# Patient Record
Sex: Female | Born: 1975 | Race: White | Hispanic: No | Marital: Married
Health system: Southern US, Community
[De-identification: ages and names within clinical notes are randomized; demographics above are authoritative.]

---

## 2009-03-15 ENCOUNTER — Encounter: Admission: RE | Admit: 2009-03-15 | Discharge: 2009-03-15 | Payer: Self-pay | Admitting: Obstetrics and Gynecology

## 2012-04-27 ENCOUNTER — Encounter: Payer: Self-pay | Admitting: Obstetrics & Gynecology

## 2012-04-27 DIAGNOSIS — Z01419 Encounter for gynecological examination (general) (routine) without abnormal findings: Secondary | ICD-10-CM

## 2018-02-10 ENCOUNTER — Other Ambulatory Visit: Payer: Self-pay | Admitting: Obstetrics and Gynecology

## 2018-04-30 ENCOUNTER — Emergency Department (HOSPITAL_COMMUNITY): Payer: BLUE CROSS/BLUE SHIELD

## 2018-04-30 ENCOUNTER — Emergency Department (HOSPITAL_COMMUNITY)
Admission: EM | Admit: 2018-04-30 | Discharge: 2018-05-01 | Disposition: A | Discharge status: Home / Self Care | Payer: BLUE CROSS/BLUE SHIELD | Attending: Emergency Medicine | Admitting: Emergency Medicine

## 2018-04-30 ENCOUNTER — Encounter (HOSPITAL_COMMUNITY): Payer: Self-pay | Admitting: Emergency Medicine

## 2018-04-30 DIAGNOSIS — R079 Chest pain, unspecified: Secondary | ICD-10-CM | POA: Diagnosis present

## 2018-04-30 DIAGNOSIS — Z79899 Other long term (current) drug therapy: Secondary | ICD-10-CM | POA: Diagnosis not present

## 2018-04-30 DIAGNOSIS — R0789 Other chest pain: Secondary | ICD-10-CM | POA: Diagnosis not present

## 2018-04-30 LAB — BASIC METABOLIC PANEL
Anion gap: 11 (ref 5–15)
BUN: 8 mg/dL (ref 6–20)
CO2: 25 mmol/L (ref 22–32)
Calcium: 9.3 mg/dL (ref 8.9–10.3)
Chloride: 106 mmol/L (ref 98–111)
Creatinine, Ser: 0.7 mg/dL (ref 0.44–1.00)
GFR calc Af Amer: >60 mL/min (ref >60)
GFR calc non Af Amer: >60 mL/min (ref >60)
Glucose, Bld: 106 mg/dL — ABNORMAL HIGH (ref 70–99)
Potassium: 3.8 mmol/L (ref 3.5–5.1)
Sodium: 142 mmol/L (ref 135–145)

## 2018-04-30 LAB — CBC
HCT: 45.1 % (ref 36.0–46.0)
Hemoglobin: 14.5 g/dL (ref 12.0–15.0)
MCH: 27.7 pg (ref 26.0–34.0)
MCHC: 32.2 g/dL (ref 30.0–36.0)
MCV: 86.2 fL (ref 80.0–100.0)
Platelets: 359 10*3/uL (ref 150–400)
RBC: 5.23 MIL/uL — ABNORMAL HIGH (ref 3.87–5.11)
RDW: 12.8 % (ref 11.5–15.5)
WBC: 12 10*3/uL — ABNORMAL HIGH (ref 4.0–10.5)
nRBC: 0 % (ref 0.0–0.2)

## 2018-04-30 LAB — I-STAT BETA HCG BLOOD, ED (MC, WL, AP ONLY): I-stat hCG, quantitative: <5 m[IU]/mL (ref <5)

## 2018-04-30 LAB — I-STAT TROPONIN, ED: Troponin i, poc: 0 ng/mL (ref 0.00–0.08)

## 2018-04-30 MED ORDER — SODIUM CHLORIDE 0.9 % IV BOLUS
1000.0000 mL | Freq: Once | INTRAVENOUS | Status: AC
  Administered 2018-04-30: 1000 mL via INTRAVENOUS

## 2018-04-30 MED ORDER — NITROGLYCERIN 0.4 MG SL SUBL
0.4000 mg | SUBLINGUAL_TABLET | SUBLINGUAL | Status: DC | PRN
Start: 2018-04-30 — End: 2018-05-01
  Administered 2018-04-30 (×2): 0.4 mg via SUBLINGUAL
  Filled 2018-04-30: qty 1

## 2018-04-30 MED ORDER — ONDANSETRON HCL 4 MG/2ML IJ SOLN
4.0000 mg | Freq: Once | INTRAMUSCULAR | Status: AC
  Administered 2018-04-30: 4 mg via INTRAVENOUS
  Filled 2018-04-30: qty 2

## 2018-04-30 NOTE — ED Provider Notes (Signed)
[facility]   CSN: [id number] Arrival date & time: 04/30/18  1928     History   Chief Complaint Chief Complaint  Patient presents with  . Chest Pain  . Near Syncope    HPI Melissa Wang is a 42 y.o. female with history of anxiety, asthma, irritable bowel syndrome, mild mitral valve prolapse, hypertension, chronic fatigue presents for evaluation of acute onset, improved episode of chest pain.  Patient reports that for the last week or so she has noted her blood pressure has been higher than usual.  She reports that her blood pressures typically well controlled with her medications with average BP around 130/90.  This week her blood pressure has been around 160/115.  She has been taking her medications as prescribed.  She does note some increased stress with her 541 year old daughter deciding to leave her home "on bad terms "and her PCP started her on a low-dose Xanax which she has been taking around once daily as needed for anxiety with improvement.  She does note that she has been having intermittent headaches consistent with migraines she has had in the past this week and went to her PCP who gave her a shot of "a medication that started with the letter T" with improvement in her headaches.   At around 6 PM while sitting in a movie theater she began to feel substernal chest tightness and pressure which radiated up to her neck and jaw.  She felt associated nausea and lightheadedness and fatigue but no vomiting or syncope.  She reports she did feel cold during this episode which lasted approximately 30 to 45 minutes and resolved.  She reports that she does have some very mild chest tightness at this time but not like her initial symptoms.  She was not given any medications with EMS.  No aggravating or alleviating factors noted.  She is a non-smoker, no recreational drug use or excessive alcohol intake.  She does see a cardiologist for her  hypertension, mitral valve prolapse, and palpitations.  She denies recent travel or surgeries, hemoptysis, prior history of DVT or PE, or current use of OCPs.  The history is provided by the patient.    History reviewed. No pertinent past medical history.  There are no active problems to display for this patient.   OB History   No obstetric history on file.      Home Medications    Prior to Admission medications   Medication Sig Start Date End Date Taking? Authorizing Provider  albuterol (PROAIR HFA) 108 (90 Base) MCG/ACT inhaler Inhale 2 puffs into the lungs every 6 (six) hours as needed for wheezing or shortness of breath.   Yes [provider]  ALPRAZolam (XANAX) 0.25 MG tablet Take 0.25 mg by mouth daily as needed for anxiety.    Yes [provider]  Aspirin-Acetaminophen-Caffeine (GOODY HEADACHE PO) Take 1 packet by mouth daily as needed (for severe headaches).   Yes [provider]  Cholecalciferol (VITAMIN D-3) 25 MCG (1000 UT) CAPS Take 1,000 Units by mouth daily.   Yes [provider]  ibuprofen (ADVIL,MOTRIN) 200 MG tablet Take 200-800 mg by mouth every 6 (six) hours as needed (for pain).   Yes [provider]  levocetirizine (XYZAL) 5 MG tablet Take 5 mg by mouth daily.   Yes [provider]  montelukast (SINGULAIR) 10 MG tablet Take 10 mg by mouth at bedtime. 08/19/17  Yes [provider]  Nebulizer MISC  Take 1 ampule by nebulization every 6 (six) hours as needed (for shortness of breath or wheezing).   Yes [provider]  Nutritional Supplements (JUICE PLUS FIBRE PO) Take 1 capsule by mouth daily.    Yes [provider]  pantoprazole (PROTONIX) 40 MG tablet Take 40 mg by mouth See admin instructions. Take 40 mg by mouth at bedtime and an additional 40 mg once a day as needed for heartburn 07/19/17  Yes [provider]  rizatriptan (MAXALT-MLT) 10 MG disintegrating tablet Take 10 mg by  mouth See admin instructions. Dissolve 10 mg in the mouth at onset of migraine headache and may repeat in 2 hours, if no relief   Yes [provider]  verapamil (CALAN-SR) 180 MG CR tablet Take 180 mg by mouth 2 (two) times daily. 07/19/17  Yes [provider]    Family History History reviewed. No pertinent family history.  Social History Social History   Tobacco Use  . Smoking status: Not on file  Substance Use Topics  . Alcohol use: Not on file  . Drug use: Not on file     Allergies   Prednisone   Review of Systems Review of Systems  Constitutional: Positive for fatigue.  Respiratory: Positive for chest tightness and shortness of breath.   Cardiovascular: Positive for chest pain.  Gastrointestinal: Positive for nausea. Negative for abdominal pain and vomiting.  Neurological: Positive for headaches (resolved).  All other systems reviewed and are negative.    Physical Exam Updated Vital Signs BP (!) 141/81 (BP Location: Right Arm)   Pulse 64   Temp 97.9 F (36.6 C) (Oral)   Resp 15   Ht 4\' 11"  (1.499 m)   Wt 79.8 kg   SpO2 98%   BMI 35.55 kg/m   Physical Exam Vitals signs and nursing note reviewed.  Constitutional:      General: She is not in acute distress.    Appearance: She is well-developed.  HENT:     Head: Normocephalic and atraumatic.  Eyes:     General:        Right eye: No discharge.        Left eye: No discharge.     Conjunctiva/sclera: Conjunctivae normal.  Neck:     Vascular: No JVD.     Trachea: No tracheal deviation.  Cardiovascular:     Rate and Rhythm: Normal rate and regular rhythm.     Comments: 2+ radial and DP/PT pulses bilaterally, Homans sign absent bilaterally, no lower extremity edema, no palpable cords, compartments are soft  Pulmonary:     Effort: Pulmonary effort is normal.     Breath sounds: Normal breath sounds.  Chest:     Chest wall: No mass or tenderness.  Abdominal:     General: Bowel sounds are  normal. There is no distension.     Palpations: Abdomen is soft.     Tenderness: There is no abdominal tenderness.  Musculoskeletal:     Right lower leg: She exhibits no tenderness. No edema.     Left lower leg: She exhibits no tenderness. No edema.  Skin:    General: Skin is warm and dry.     Findings: No erythema.  Neurological:     Mental Status: She is alert.  Psychiatric:        Behavior: Behavior normal.      ED Treatments / Results  Labs (all labs ordered are listed, but only abnormal results are displayed) Labs Reviewed  BASIC  METABOLIC PANEL - Abnormal; Notable for the following components:      Result Value   Glucose, Bld 106 (*)    All other components within normal limits  CBC - Abnormal; Notable for the following components:   WBC 12.0 (*)    RBC 5.23 (*)    All other components within normal limits  I-STAT TROPONIN, ED  I-STAT BETA HCG BLOOD, ED ([facility], [facility], [facility] ONLY)  I-STAT TROPONIN, ED    EKG EKG Interpretation  Date/Time:  Friday April 30 2018 19:30:54 EST Ventricular Rate:  79 PR Interval:    QRS Duration: 94 QT Interval:  389 QTC Calculation: 446 R Axis:   98 Text Interpretation:  Sinus rhythm Borderline right axis deviation Low voltage, precordial leads Confirmed by Raeford Razor [phone]) on 04/30/2018 7:58:55 PM   Radiology Dg Chest 2 View  Result Date: 04/30/2018 CLINICAL DATA:  Short of breath with chest pain EXAM: CHEST - 2 VIEW COMPARISON:  06/19/2008 FINDINGS: The heart size and mediastinal contours are within normal limits. Both lungs are clear. Rightward scoliosis of the spine. IMPRESSION: No active cardiopulmonary disease. Electronically Signed   By: Jasmine Pang M.D.   On: 04/30/2018 20:14    Procedures Procedures (including critical care time)  Medications Ordered in ED Medications  nitroGLYCERIN (NITROSTAT) SL tablet 0.4 mg (0.4 mg Sublingual Given 04/30/18 2130)  sodium chloride 0.9 % bolus 1,000 mL (0 mLs Intravenous  Stopped 04/30/18 2249)  ondansetron (ZOFRAN) injection 4 mg (4 mg Intravenous Given 04/30/18 2056)     Initial Impression / Assessment and Plan / ED Course  I have reviewed the triage vital signs and the nursing notes.  Pertinent labs & imaging results that were available during my care of the patient were reviewed by me and considered in my medical decision making (see chart for details).     Patient presents for evaluation of substernal chest pain and lightheadedness.  She is afebrile, initially hypertensive with improvement on reevaluation.  She is nontoxic in appearance.  Pain is not pleuritic, exertional, or reproducible on palpation.  She was given sublingual nitroglycerin in the ED without significant relief but eventually her symptoms began to improve.  Lab work reviewed by me shows mild nonspecific leukocytosis, no anemia, no metabolic derangements or renal insufficiency.  Serial troponins are negative and EKG shows normal sinus rhythm with no ischemic changes.  Chest x-ray shows no acute cardiopulmonary abnormalities.  No evidence of ACS/MI, PE, dissection, cardiac tamponade, esophageal rupture, pneumonia, or pneumothorax.  No abdominal pain to suggest intra-abdominal pathology such as cholecystitis, pancreatitis, gastritis.  On reevaluation patient is resting comfortably no apparent distress.  She reports her pain is significantly improved and she feels comfortable with discharge home.  She does have a cardiologist with whom she can follow-up.  Discuss strict ED return precautions.  Recommend follow-up with cardiologist in the next 2 to 3 days.  Patient and husband verbalized understanding of and agreement with plan and patient is stable for discharge home at this time.  Final Clinical Impressions(s) / ED Diagnoses   Final diagnoses:  Atypical chest pain    ED Discharge Orders    None       Bennye Alm 05/01/18 0027    Raeford Razor, MD 05/02/18 [phone]

## 2018-04-30 NOTE — ED Triage Notes (Signed)
  Patient BIB EMS for chest pain and near syncopal episode.  Patient was at movie theater with her husband and started to feel dizzy and had a headache, patient took her migraine medication and started having mid sternal chest pains about 30 mins later.  Patient was having chest tightness with pain radiating to her neck.  Husband states she started to fall and he kept her from hitting the floor.  Pain currently 1/10.  Has nausea but no vomiting.

## 2018-05-01 LAB — I-STAT TROPONIN, ED: Troponin i, poc: 0 ng/mL (ref 0.00–0.08)

## 2018-05-01 NOTE — ED Notes (Signed)
Patient verbalizes understanding of medications and discharge instructions. No further questions at this time. VSS and patient ambulatory at discharge.   

## 2018-05-01 NOTE — Discharge Instructions (Addendum)
Your workup today was reassuring that you are not having a heart attack.  Please follow-up with your cardiologist for reevaluation of your symptoms.  In the meantime, continue to take your home medicines as prescribed.  You can take ibuprofen or Tylenol as needed for pain.  Alternate every 3-4 hours.  Return to the emergency department if any concerning signs or symptoms develop such as high fevers, worsening chest pain or persistent shortness of breath, passing out, or persistent vomiting.

## 2019-08-23 IMAGING — DX DG CHEST 2V
2 series · 2 of 2 positions shown · non-contrast
Comparison: 06/19/2008

CLINICAL DATA: Short of breath with chest pain

EXAM:
CHEST - 2 VIEW

[chest pa]
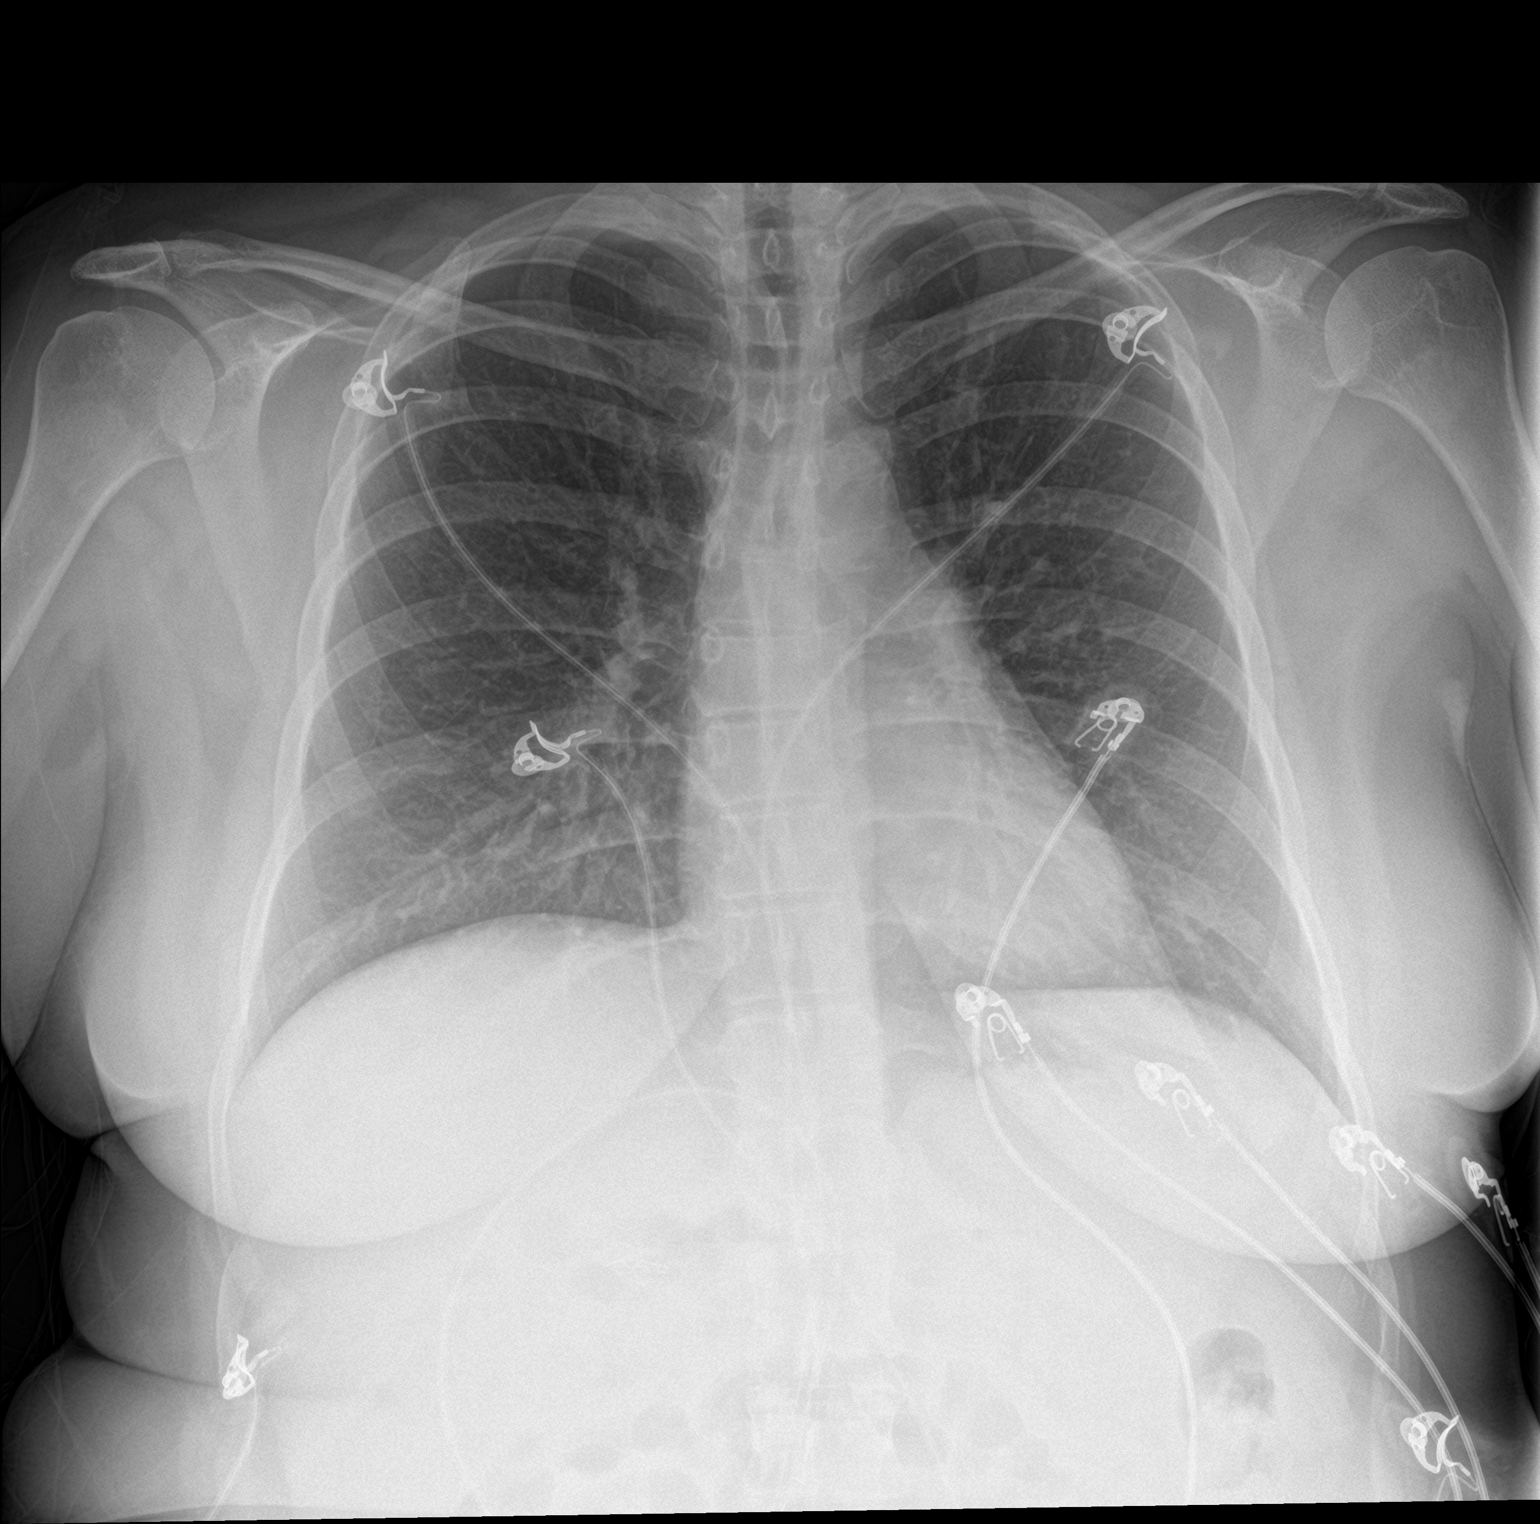

[chest lat]
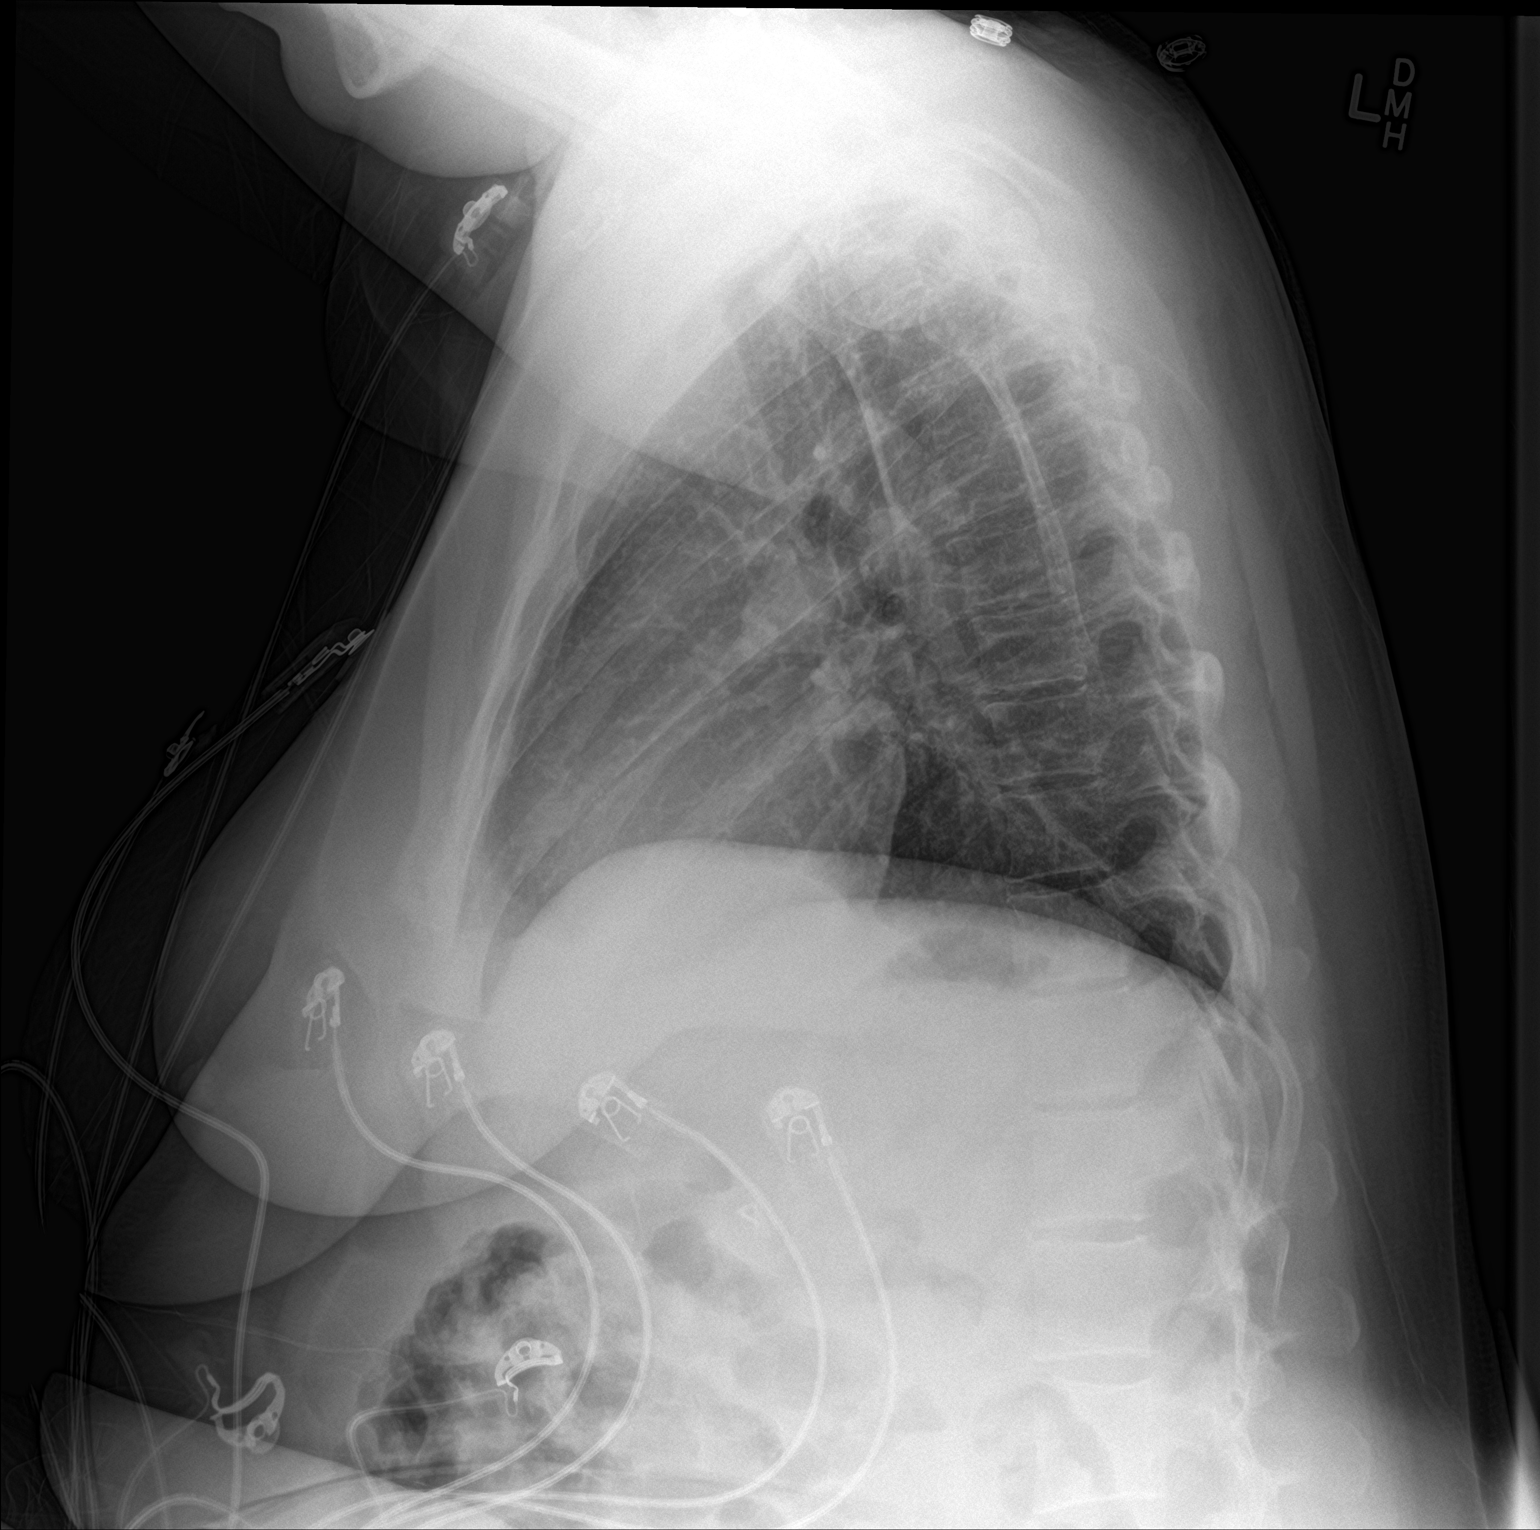

[2 of 2 positions shown; findings below may reference images not displayed]

FINDINGS: The heart size and mediastinal contours are within normal limits.
Both lungs are clear. Rightward scoliosis of the spine.
IMPRESSION: No active cardiopulmonary disease.

## 2021-11-07 ENCOUNTER — Ambulatory Visit: Payer: Self-pay | Admitting: Allergy and Immunology
